# Patient Record
Sex: Female | Born: 1995 | Race: Black or African American | Hispanic: No | Marital: Married | Smoking: Current some day smoker
Health system: Southern US, Community
[De-identification: ages and names within clinical notes are randomized; demographics above are authoritative.]

## PROBLEM LIST (undated history)

## (undated) ENCOUNTER — Ambulatory Visit (HOSPITAL_COMMUNITY): Admission: EM | Payer: Self-pay | Source: Home / Self Care

## (undated) DIAGNOSIS — F909 Attention-deficit hyperactivity disorder, unspecified type: Secondary | ICD-10-CM

## (undated) DIAGNOSIS — R51 Headache: Secondary | ICD-10-CM

## (undated) DIAGNOSIS — R569 Unspecified convulsions: Secondary | ICD-10-CM

## (undated) DIAGNOSIS — R519 Headache, unspecified: Secondary | ICD-10-CM

## (undated) HISTORY — DX: Headache: R51

## (undated) HISTORY — DX: Headache, unspecified: R51.9

---
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
## 2017-02-24 IMAGING — US US ABDOMEN COMPLETE
1 series · 14 of 25 positions shown · non-contrast
Comparison: None.

CLINICAL DATA: Chronic generalized abdominal pain.

EXAM:
ABDOMEN ULTRASOUND COMPLETE

[Series 1: us abdomen complete · 0.17mm/px · 14 of 109 slices shown]
[im 1/109]
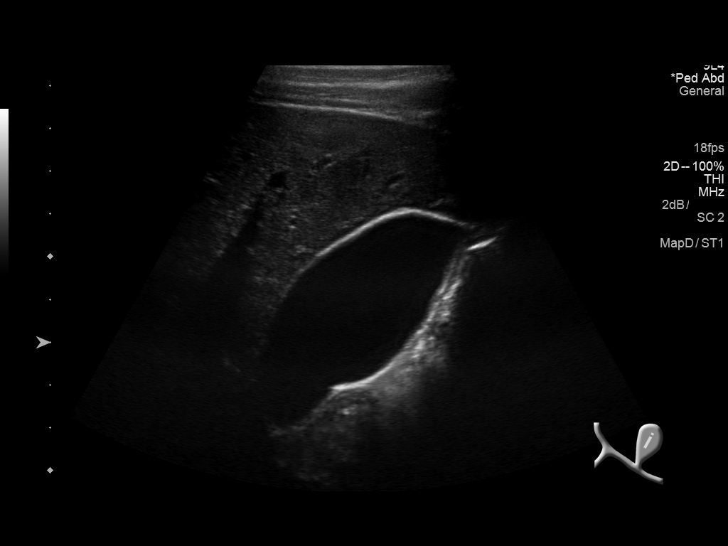
[im 10/109]
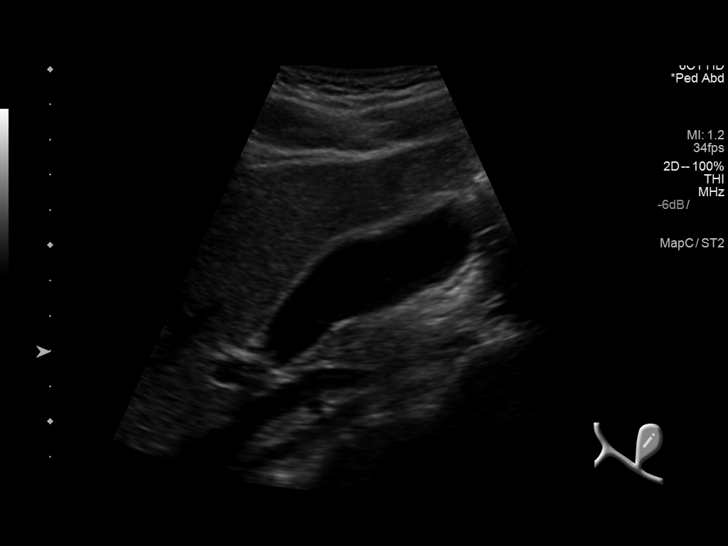
[im 19/109]
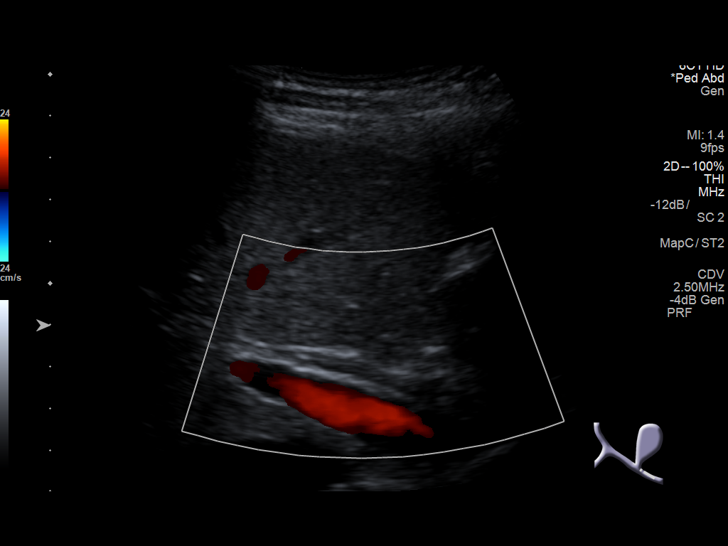
[im 28/109]
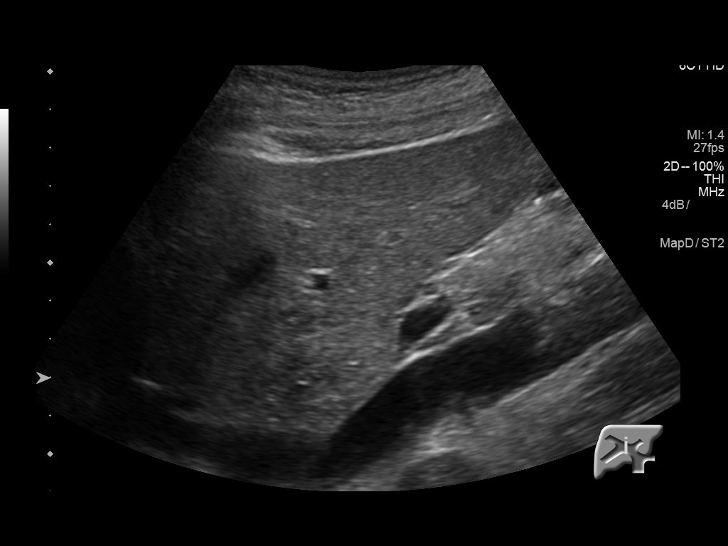
[im 37/109]
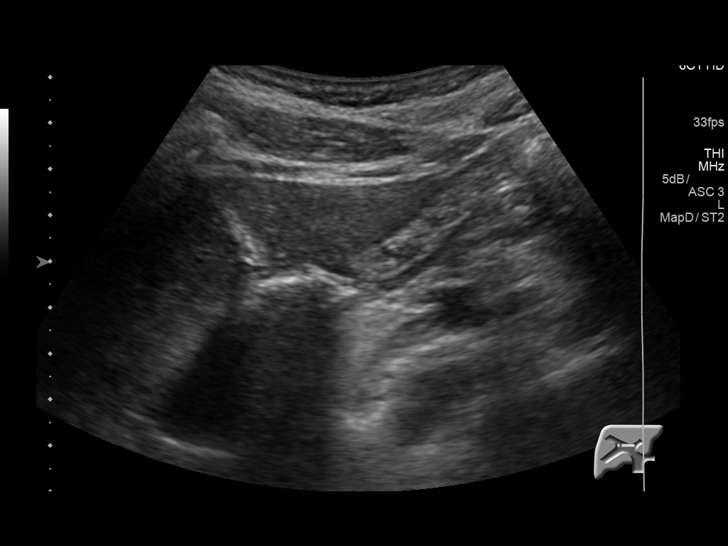
[im 41/109]
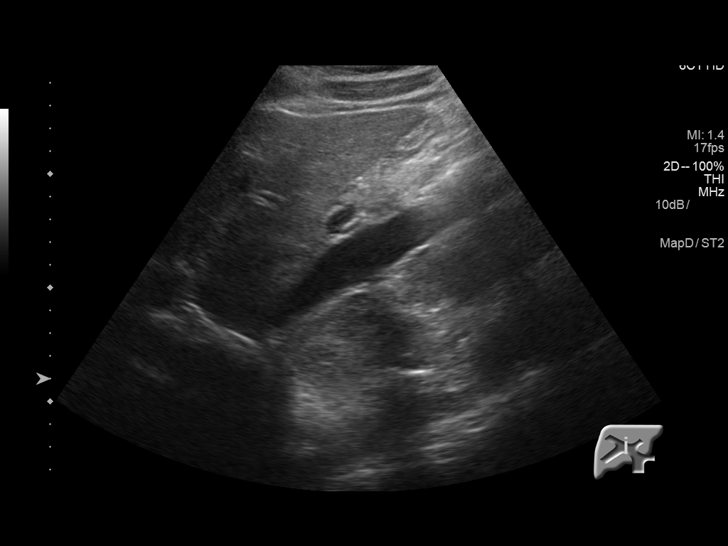
[im 50/109]
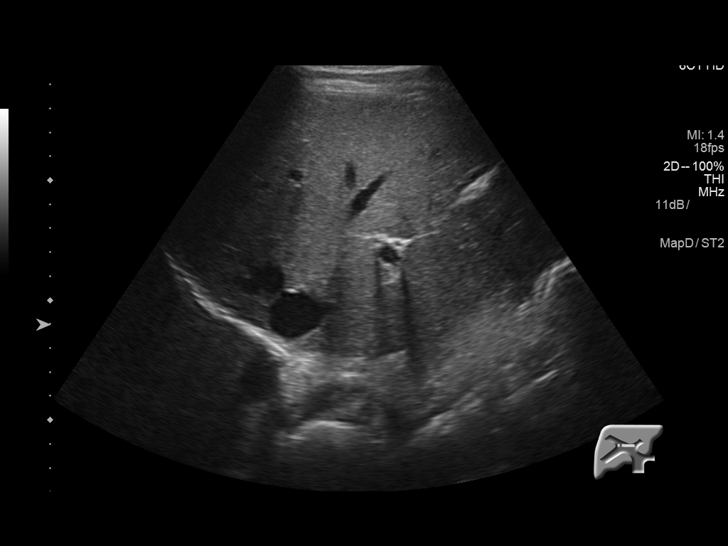
[im 59/109]
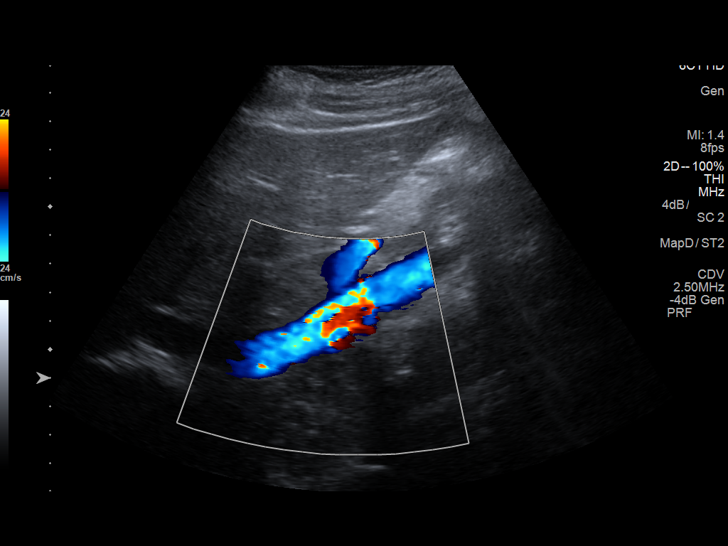
[im 68/109]
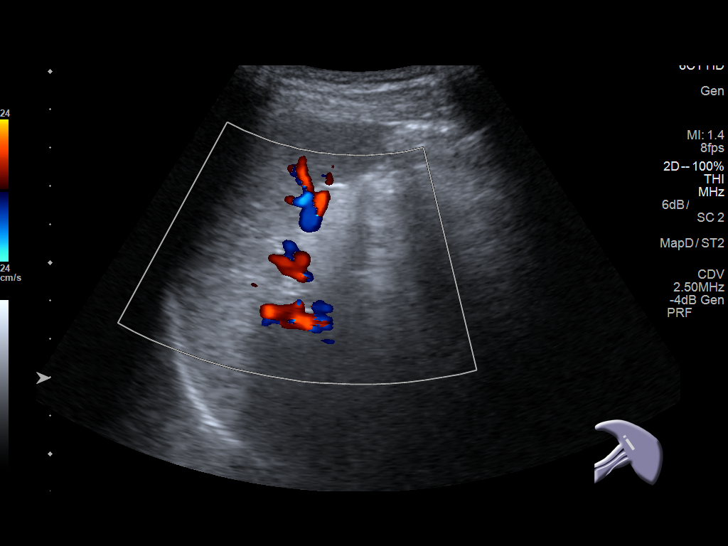
[im 73/109]
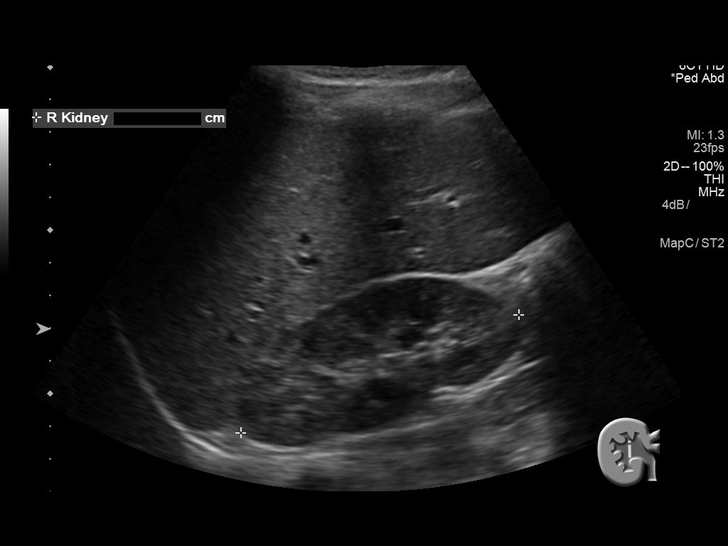
[im 82/109]
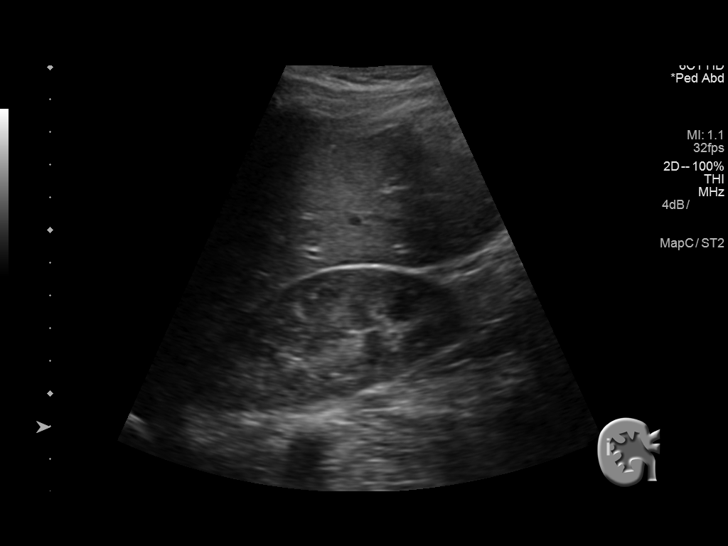
[im 91/109]
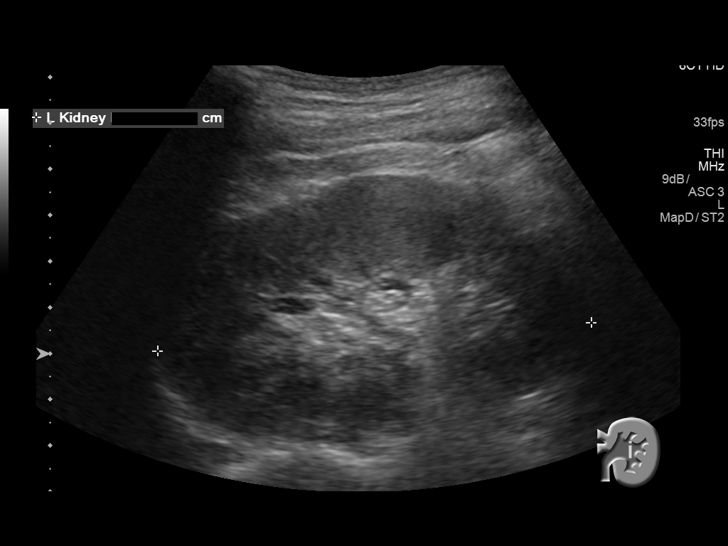
[im 100/109]
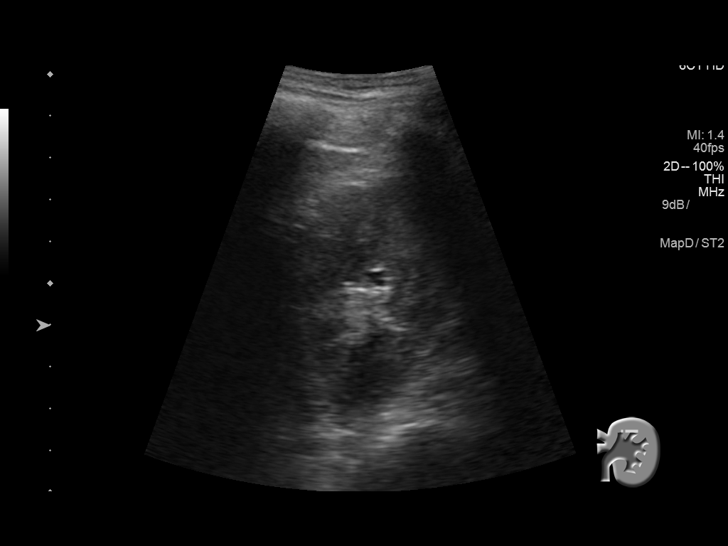
[im 109/109]
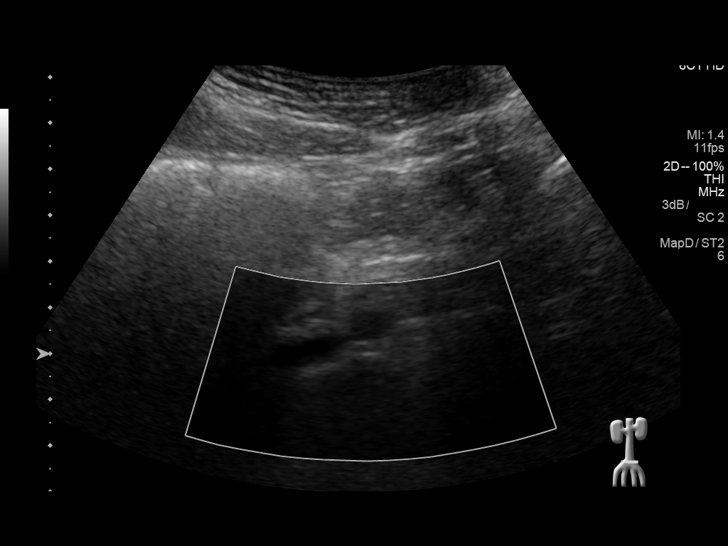

[14 of 25 positions shown; findings below may reference images not displayed]

FINDINGS: Gallbladder: No gallstones or wall thickening visualized. No
sonographic Murphy sign noted by sonographer.

Common bile duct: Diameter: 3.5 mm which is within normal limits.

Liver: No focal lesion identified. Within normal limits in
parenchymal echogenicity.

IVC: No abnormality visualized.

Pancreas: Visualized portion unremarkable.

Spleen: Size and appearance within normal limits.

Right Kidney: Length: 9.2 cm. Echogenicity within normal limits. No
mass or hydronephrosis visualized.

Left Kidney: Length: 9.4 cm. Echogenicity within normal limits. No
mass or hydronephrosis visualized.

Abdominal aorta: No aneurysm visualized.

Other findings: None.
IMPRESSION: No definite abnormality seen in the abdomen.

## 2021-08-17 DIAGNOSIS — R569 Unspecified convulsions: Secondary | ICD-10-CM | POA: Diagnosis not present

## 2021-11-30 ENCOUNTER — Emergency Department: Payer: Medicaid Other

## 2021-11-30 ENCOUNTER — Emergency Department
Admission: EM | Admit: 2021-11-30 | Discharge: 2021-11-30 | Disposition: A | Discharge status: Home / Self Care | Payer: Medicaid Other | Attending: Emergency Medicine | Admitting: Emergency Medicine

## 2021-11-30 ENCOUNTER — Other Ambulatory Visit: Payer: Self-pay

## 2021-11-30 DIAGNOSIS — R569 Unspecified convulsions: Secondary | ICD-10-CM | POA: Diagnosis not present

## 2021-11-30 DIAGNOSIS — G40909 Epilepsy, unspecified, not intractable, without status epilepticus: Secondary | ICD-10-CM | POA: Diagnosis present

## 2021-11-30 LAB — COMPREHENSIVE METABOLIC PANEL
ALT: 12 U/L (ref 0–44)
AST: 16 U/L (ref 15–41)
Albumin: 4.1 g/dL (ref 3.5–5.0)
Alkaline Phosphatase: 33 U/L — ABNORMAL LOW (ref 38–126)
Anion gap: 6 (ref 5–15)
BUN: 13 mg/dL (ref 6–20)
CO2: 25 mmol/L (ref 22–32)
Calcium: 9 mg/dL (ref 8.9–10.3)
Chloride: 107 mmol/L (ref 98–111)
Creatinine, Ser: 0.83 mg/dL (ref 0.44–1.00)
GFR, Estimated: >60 mL/min (ref >60)
Glucose, Bld: 129 mg/dL — ABNORMAL HIGH (ref 70–99)
Potassium: 3 mmol/L — ABNORMAL LOW (ref 3.5–5.1)
Sodium: 138 mmol/L (ref 135–145)
Total Bilirubin: 0.6 mg/dL (ref 0.3–1.2)
Total Protein: 7.3 g/dL (ref 6.5–8.1)

## 2021-11-30 LAB — CBC WITH DIFFERENTIAL/PLATELET
Abs Immature Granulocytes: 0.01 10*3/uL (ref 0.00–0.07)
Basophils Absolute: 0.1 10*3/uL (ref 0.0–0.1)
Basophils Relative: 1 %
Eosinophils Absolute: 0.1 10*3/uL (ref 0.0–0.5)
Eosinophils Relative: 2 %
HCT: 33.7 % — ABNORMAL LOW (ref 36.0–46.0)
Hemoglobin: 11.2 g/dL — ABNORMAL LOW (ref 12.0–15.0)
Immature Granulocytes: 0 %
Lymphocytes Relative: 49 %
Lymphs Abs: 2.4 10*3/uL (ref 0.7–4.0)
MCH: 33.6 pg (ref 26.0–34.0)
MCHC: 33.2 g/dL (ref 30.0–36.0)
MCV: 101.2 fL — ABNORMAL HIGH (ref 80.0–100.0)
Monocytes Absolute: 0.3 10*3/uL (ref 0.1–1.0)
Monocytes Relative: 7 %
Neutro Abs: 2 10*3/uL (ref 1.7–7.7)
Neutrophils Relative %: 41 %
Platelets: 245 10*3/uL (ref 150–400)
RBC: 3.33 MIL/uL — ABNORMAL LOW (ref 3.87–5.11)
RDW: 12.5 % (ref 11.5–15.5)
WBC: 4.9 10*3/uL (ref 4.0–10.5)
nRBC: 0 % (ref 0.0–0.2)

## 2021-11-30 LAB — HCG, QUANTITATIVE, PREGNANCY: hCG, Beta Chain, Quant, S: <1 m[IU]/mL (ref <5)

## 2021-11-30 MED ORDER — POTASSIUM CHLORIDE CRYS ER 20 MEQ PO TBCR
40.0000 meq | EXTENDED_RELEASE_TABLET | Freq: Once | ORAL | Status: AC
  Administered 2021-11-30: 40 meq via ORAL
  Filled 2021-11-30 (×2): qty 2

## 2021-11-30 MED ORDER — ACETAMINOPHEN 500 MG PO TABS
1000.0000 mg | ORAL_TABLET | Freq: Once | ORAL | Status: AC
  Administered 2021-11-30: 1000 mg via ORAL
  Filled 2021-11-30: qty 2

## 2021-11-30 MED ORDER — LEVETIRACETAM 500 MG PO TABS: 1000.0000 mg | ORAL_TABLET | Freq: Two times a day (BID) | ORAL | 0 refills | Status: AC

## 2021-11-30 MED ORDER — SODIUM CHLORIDE 0.9 % IV SOLN
2000.0000 mg | Freq: Once | INTRAVENOUS | Status: AC
  Administered 2021-11-30: 2000 mg via INTRAVENOUS
  Filled 2021-11-30: qty 20

## 2021-11-30 NOTE — ED Provider Notes (Addendum)
[facility] Provider Note    Event Date/Time   First MD Initiated Contact with Patient 11/30/21 1808     (approximate)   History   Seizures   HPI  Alicia Stone is a 26 y.o. female with past medical history of seizure disorder not on any AEDs who presents with possible seizure.  Per EMS patient's family said she had a seizure for about 5 minutes.  Initially patient was unresponsive but then started to open her eyes but not speaking.  Did not receive any benzos in the field.  Patient is not talking but she is opening her eyes and nodding yes and no to questions says she does not take any medications.  Denies any recent illnesses does endorse headache says that she typically gets headache after seizure.  Reviewed inpatient admission at [facility] from 2022 patient required intubation and sedation for status epilepticus.  Was discharged on Keppra.     Past Medical History:  Diagnosis Date   ADHD (attention deficit hyperactivity disorder)    Headache    Seizure [facility])     Patient Active Problem List   Diagnosis Date Noted   Annual physical exam 11/02/2014   Family planning 08/17/2014   Seizure disorder (HCC) 08/17/2014   Migraine without aura and without status migrainosus, not intractable 02/21/2014     Physical Exam  Triage Vital Signs: ED Triage Vitals  Enc Vitals Group     BP 11/30/21 1800 126/76     Pulse Rate 11/30/21 1800 84     Resp 11/30/21 1800 17     Temp 11/30/21 1800 98.8 F (37.1 C)     Temp Source 11/30/21 1800 Oral     SpO2 11/30/21 1800 99 %     Weight 11/30/21 1802 123 lb 7.3 oz (56 kg)     Height 11/30/21 1802 5\' 10"  (1.778 m)     Head Circumference --      Peak Flow --      Pain Score 11/30/21 1802 0     Pain Loc --      Pain Edu? --      Excl. in GC? --     Most recent vital signs: Vitals:   11/30/21 1800 11/30/21 1900  BP: 126/76 (!) 110/55  Pulse: 84 80  Resp: 17 18  Temp: 98.8 F (37.1 C)   SpO2: 99% 99%      General: Awake, no distress.  CV:  Good peripheral perfusion.  Resp:  Normal effort.  Abd:  No distention.  Neuro:             Awake, Alert, patient's eyes are open and she moves all of her extremities but is not speaking somewhat somnolent Other:     ED Results / Procedures / Treatments  Labs (all labs ordered are listed, but only abnormal results are displayed) Labs Reviewed  COMPREHENSIVE METABOLIC PANEL - Abnormal; Notable for the following components:      Result Value   Potassium 3.0 (*)    Glucose, Bld 129 (*)    Alkaline Phosphatase 33 (*)    All other components within normal limits  CBC WITH DIFFERENTIAL/PLATELET - Abnormal; Notable for the following components:   RBC 3.33 (*)    Hemoglobin 11.2 (*)    HCT 33.7 (*)    MCV 101.2 (*)    All other components within normal limits  HCG, QUANTITATIVE, PREGNANCY     EKG     RADIOLOGY  I  reviewed and interpreted the CT scan of the brain which does not show any acute intracranial process   PROCEDURES:  Critical Care performed: No  Procedures  The patient is on the cardiac monitor to evaluate for evidence of arrhythmia and/or significant heart rate changes.   MEDICATIONS ORDERED IN ED: Medications  levETIRAcetam (KEPPRA) 2,000 mg in sodium chloride 0.9 % 250 mL IVPB (0 mg Intravenous Stopped 11/30/21 1942)  potassium chloride SA (KLOR-CON M) CR tablet 40 mEq (40 mEq Oral Given 11/30/21 1950)  acetaminophen (TYLENOL) tablet 1,000 mg (1,000 mg Oral Given 11/30/21 1949)     IMPRESSION / MDM / ASSESSMENT AND PLAN / ED COURSE  I reviewed the triage vital signs and the nursing notes.                              Patient's presentation is most consistent with acute presentation with potential threat to life or bodily function.  Differential diagnosis includes, but is not limited to, seizure, pseudoseizure, syncope, intoxication, overdose  Patient is a 26 year old female with history of underlying epilepsy  not currently on any AEDs presents with concern for 5-minute seizure per family report.  Her vitals are reassuring patient appears somewhat postictal she is somnolent but opens her eyes not yet talking but moves all of her extremities pupils are equal and reactive.  We will load with Keppra as I see patient had been prescribed Keppra when she was discharged from [facility] after she required admission for status epilepticus requiring intubation.  We will check basic labs and pregnancy test.  Given patient's known history of seizures if she continues to progress and return to baseline do not think head imaging is necessary.    7:58 PM Patient improving she is not talking to me says she has a sore throat and headache.  Given patient still not back to baseline and does not have frequent CTs in our system will obtain CT of head and give Tylenol for sore throat.  Posterior oropharyngeal exam is unremarkable.   FINAL CLINICAL IMPRESSION(S) / ED DIAGNOSES   Final diagnoses:  Seizure (HCC)     Rx / DC Orders   ED Discharge Orders     None        Note:  This document was prepared using Dragon voice recognition software and may include unintentional dictation errors.   Georga Hacking, MD 11/30/21 1919    Georga Hacking, MD 11/30/21 1958

## 2021-11-30 NOTE — ED Notes (Signed)
Pt taken to CT.

## 2021-11-30 NOTE — ED Notes (Addendum)
[phone] Juliette Alcide (aunt) [place] [phone]

## 2021-11-30 NOTE — ED Notes (Signed)
Pt is more alert at this time and was able to swallow her pills with no issue. Called her family members at this time per her request but noone answered. She did leave a voicemail for her grandmother.

## 2021-11-30 NOTE — Discharge Instructions (Addendum)
Please start taking the Keppra 1 g twice daily for 30 days.  Please follow-up with the neurologist above for further management of your seizure disorder.

## 2021-11-30 NOTE — ED Triage Notes (Addendum)
Pt family states she had a seizure for 5 min when fire and EMS arrived pt was unresponsive. Pt arrives via EMS still postictal pt is alert and protecting her own airway. Pt will nod yes and no but is not speaking. Pt has a hx of seizures but is not on meds according to family.

## 2021-11-30 NOTE — ED Notes (Signed)
This RN called again and grandmother answered. Pt is currently on the phone with her.

## 2021-11-30 NOTE — ED Notes (Signed)
Seizure pads placed on bed

## 2021-11-30 NOTE — ED Notes (Signed)
Aunt came to pick up pt. Pt is aaox4 with steady gait.

## 2022-01-21 DIAGNOSIS — Z1389 Encounter for screening for other disorder: Secondary | ICD-10-CM | POA: Diagnosis not present

## 2022-01-21 DIAGNOSIS — R630 Anorexia: Secondary | ICD-10-CM | POA: Diagnosis not present

## 2022-01-21 DIAGNOSIS — Z7189 Other specified counseling: Secondary | ICD-10-CM | POA: Diagnosis not present

## 2022-01-21 DIAGNOSIS — Z131 Encounter for screening for diabetes mellitus: Secondary | ICD-10-CM | POA: Diagnosis not present

## 2022-01-21 DIAGNOSIS — Z1159 Encounter for screening for other viral diseases: Secondary | ICD-10-CM | POA: Diagnosis not present

## 2022-01-21 DIAGNOSIS — Z13220 Encounter for screening for lipoid disorders: Secondary | ICD-10-CM | POA: Diagnosis not present

## 2022-02-16 DIAGNOSIS — R569 Unspecified convulsions: Secondary | ICD-10-CM | POA: Diagnosis not present

## 2022-02-16 DIAGNOSIS — G4089 Other seizures: Secondary | ICD-10-CM | POA: Diagnosis not present

## 2022-04-25 DIAGNOSIS — Z124 Encounter for screening for malignant neoplasm of cervix: Secondary | ICD-10-CM | POA: Diagnosis not present

## 2022-04-25 DIAGNOSIS — Z1389 Encounter for screening for other disorder: Secondary | ICD-10-CM | POA: Diagnosis not present

## 2022-04-25 DIAGNOSIS — F411 Generalized anxiety disorder: Secondary | ICD-10-CM | POA: Diagnosis not present

## 2022-04-25 DIAGNOSIS — Z113 Encounter for screening for infections with a predominantly sexual mode of transmission: Secondary | ICD-10-CM | POA: Diagnosis not present

## 2022-04-25 DIAGNOSIS — Z0000 Encounter for general adult medical examination without abnormal findings: Secondary | ICD-10-CM | POA: Diagnosis not present

## 2022-06-04 DIAGNOSIS — F411 Generalized anxiety disorder: Secondary | ICD-10-CM | POA: Diagnosis not present

## 2022-06-04 DIAGNOSIS — Z1389 Encounter for screening for other disorder: Secondary | ICD-10-CM | POA: Diagnosis not present

## 2023-10-27 ENCOUNTER — Encounter (HOSPITAL_COMMUNITY): Payer: Self-pay | Admitting: Pharmacy Technician

## 2023-10-27 ENCOUNTER — Emergency Department (HOSPITAL_COMMUNITY)
Admission: EM | Admit: 2023-10-27 | Discharge: 2023-10-27 | Disposition: A | Discharge status: Home / Self Care | Source: Ambulatory Visit | Attending: Emergency Medicine | Admitting: Emergency Medicine

## 2023-10-27 ENCOUNTER — Other Ambulatory Visit: Payer: Self-pay

## 2023-10-27 ENCOUNTER — Encounter (HOSPITAL_COMMUNITY): Payer: Self-pay | Admitting: Emergency Medicine

## 2023-10-27 ENCOUNTER — Ambulatory Visit (HOSPITAL_COMMUNITY): Admission: EM | Admit: 2023-10-27 | Discharge: 2023-10-27 | Disposition: A | Discharge status: Home / Self Care

## 2023-10-27 ENCOUNTER — Emergency Department (HOSPITAL_COMMUNITY)

## 2023-10-27 DIAGNOSIS — L02511 Cutaneous abscess of right hand: Secondary | ICD-10-CM | POA: Diagnosis not present

## 2023-10-27 DIAGNOSIS — L923 Foreign body granuloma of the skin and subcutaneous tissue: Secondary | ICD-10-CM

## 2023-10-27 DIAGNOSIS — L03113 Cellulitis of right upper limb: Secondary | ICD-10-CM | POA: Insufficient documentation

## 2023-10-27 DIAGNOSIS — M79641 Pain in right hand: Secondary | ICD-10-CM | POA: Diagnosis present

## 2023-10-27 DIAGNOSIS — Z23 Encounter for immunization: Secondary | ICD-10-CM | POA: Insufficient documentation

## 2023-10-27 DIAGNOSIS — L03011 Cellulitis of right finger: Secondary | ICD-10-CM

## 2023-10-27 DIAGNOSIS — L0291 Cutaneous abscess, unspecified: Secondary | ICD-10-CM

## 2023-10-27 LAB — COMPREHENSIVE METABOLIC PANEL WITH GFR
ALT: 12 U/L (ref 0–44)
AST: 17 U/L (ref 15–41)
Albumin: 4 g/dL (ref 3.5–5.0)
Alkaline Phosphatase: 35 U/L — ABNORMAL LOW (ref 38–126)
Anion gap: 10 (ref 5–15)
BUN: 14 mg/dL (ref 6–20)
CO2: 26 mmol/L (ref 22–32)
Calcium: 9.5 mg/dL (ref 8.9–10.3)
Chloride: 104 mmol/L (ref 98–111)
Creatinine, Ser: 0.82 mg/dL (ref 0.44–1.00)
GFR, Estimated: >60 mL/min (ref >60)
Glucose, Bld: 88 mg/dL (ref 70–99)
Potassium: 4.1 mmol/L (ref 3.5–5.1)
Sodium: 140 mmol/L (ref 135–145)
Total Bilirubin: 0.5 mg/dL (ref 0.0–1.2)
Total Protein: 7.1 g/dL (ref 6.5–8.1)

## 2023-10-27 LAB — HCG, SERUM, QUALITATIVE: Preg, Serum: NEGATIVE

## 2023-10-27 LAB — CBC WITH DIFFERENTIAL/PLATELET
Abs Immature Granulocytes: 0.01 K/uL (ref 0.00–0.07)
Basophils Absolute: 0.1 K/uL (ref 0.0–0.1)
Basophils Relative: 1 %
Eosinophils Absolute: 0.3 K/uL (ref 0.0–0.5)
Eosinophils Relative: 5 %
HCT: 34.3 % — ABNORMAL LOW (ref 36.0–46.0)
Hemoglobin: 11 g/dL — ABNORMAL LOW (ref 12.0–15.0)
Immature Granulocytes: 0 %
Lymphocytes Relative: 45 %
Lymphs Abs: 2.5 K/uL (ref 0.7–4.0)
MCH: 33.8 pg (ref 26.0–34.0)
MCHC: 32.1 g/dL (ref 30.0–36.0)
MCV: 105.5 fL — ABNORMAL HIGH (ref 80.0–100.0)
Monocytes Absolute: 0.4 K/uL (ref 0.1–1.0)
Monocytes Relative: 6 %
Neutro Abs: 2.4 K/uL (ref 1.7–7.7)
Neutrophils Relative %: 43 %
Platelets: 213 K/uL (ref 150–400)
RBC: 3.25 MIL/uL — ABNORMAL LOW (ref 3.87–5.11)
RDW: 12.3 % (ref 11.5–15.5)
WBC: 5.7 K/uL (ref 4.0–10.5)
nRBC: 0 % (ref 0.0–0.2)

## 2023-10-27 MED ORDER — TETANUS-DIPHTH-ACELL PERTUSSIS 5-2.5-18.5 LF-MCG/0.5 IM SUSY
0.5000 mL | PREFILLED_SYRINGE | Freq: Once | INTRAMUSCULAR | Status: AC
  Administered 2023-10-27: 0.5 mL via INTRAMUSCULAR
  Filled 2023-10-27: qty 0.5

## 2023-10-27 MED ORDER — KETOROLAC TROMETHAMINE 15 MG/ML IJ SOLN: 15.0000 mg | Freq: Once | INTRAMUSCULAR | Status: DC

## 2023-10-27 MED ORDER — LIDOCAINE HCL (PF) 1 % IJ SOLN
10.0000 mL | Freq: Once | INTRAMUSCULAR | Status: AC
  Administered 2023-10-27: 10 mL
  Filled 2023-10-27: qty 10

## 2023-10-27 MED ORDER — DOXYCYCLINE HYCLATE 100 MG PO CAPS: 100.0000 mg | ORAL_CAPSULE | Freq: Two times a day (BID) | ORAL | 0 refills | Status: AC

## 2023-10-27 NOTE — ED Triage Notes (Signed)
 Pt here POV from UC for possible infection to tattoo site on R hand 4th and 5th digits.

## 2023-10-27 NOTE — ED Triage Notes (Signed)
 Pt reports got tattoo on right 4th and 5th fingers last week. Reports told the artist that was going too deep because was hurting. Since Friday unable to move 4th and 5th fingers. Has drainage and some swelling.

## 2023-10-27 NOTE — ED Notes (Signed)
 Wound care performed

## 2023-10-27 NOTE — Discharge Instructions (Addendum)
 You were seen today for cellulitis and abscesses to your right hand over your 5th and 4th fingers after a tattoo.  It will continue to drain over the course of the next couple days.  However recommend you continue to follow-up with hand surgery for reevaluation and resolution of symptoms.  I am sending an antibiotic which you are to take twice a day for the next 7 days.  This can cause stomach upset, take with food help avoid this.  Recommend continue take Tylenol  and ibuprofen for pain medication.  Take Tylenol  (acetominophen)  650mg  every 4-6 hours, as needed for pain or fever. Do not take more than 4,000 mg in a 24-hour period. As this may cause liver damage. While this is rare, if you begin to develop yellowing of the skin or eyes, stop taking and return to ER immediately.  Take Ibuprofen 400mg  every 4-6 hours for pain or fever, not exceeding 3,200 mg per day as more than 3,200mg  can cause Stomach irritation, dizziness, kidney issues with long-term use.  Please return sooner if symptoms worsen as further workup may need to be done at that time.

## 2023-10-27 NOTE — ED Provider Notes (Signed)
 [facility]    CSN: [id number] Arrival date & time: 10/27/23  9141      History   Chief Complaint Chief Complaint  Patient presents with   Hand Problem    HPI Alicia Stone is a 28 y.o. female.   Patient presents today for evaluation of suspected infection secondary to tattoo she had placed on her right hand last week.  She reports that she had significant pain at the time of the tattoo and had difficulty moving her 4th and 5th fingers but tattoo artist continued.  Since Friday patient has been unable to move her 4th and 5th fingers due to pain and swelling.  She has had some drainage as well from the area.  She does not report fever.  She does not have any numbness.  The history is provided by the patient.    Past Medical History:  Diagnosis Date   ADHD (attention deficit hyperactivity disorder)    Headache    Seizure [facility])     Patient Active Problem List   Diagnosis Date Noted   Annual physical exam 11/02/2014   Family planning 08/17/2014   Seizure disorder (HCC) 08/17/2014   Migraine without aura and without status migrainosus, not intractable 02/21/2014    Past Surgical History:  Procedure Laterality Date   TONSILLECTOMY  2012    OB History   No obstetric history on file.      Home Medications    Prior to Admission medications   Medication Sig Start Date End Date Taking? Authorizing Provider  DEXEDRINE 15 MG 24 hr capsule  01/12/14   [provider]  ibuprofen (ADVIL,MOTRIN) 800 MG tablet Take 800 mg by mouth every 8 (eight) hours as needed. 09/12/14   [provider]  levETIRAcetam  (KEPPRA ) 500 MG tablet Take 2 tablets (1,000 mg total) by mouth 2 (two) times daily. 11/30/21 12/30/21  Clide Burnard Ee, MD  Oxcarbazepine  (TRILEPTAL ) 300 MG tablet TAKE 1 TABLET BY MOUTH TWICE A DAY 10/09/14   [provider]  predniSONE (DELTASONE) 10 MG tablet TAKE 6 TABLETS BY MOUTH ON DAY 1 THEN DECREASE BY 1 TABLET EACH DAY  (6,5,4,3,2,1) 09/28/14   [provider]    Family History Family History  Family history unknown: Yes    Social History Social History   Tobacco Use   Smoking status: Some Days    Current packs/day: 0.50    Types: Cigarettes    Passive exposure: Yes   Smokeless tobacco: Never  Vaping Use   Vaping status: Some Days   Substances: Nicotine  Substance Use Topics   Alcohol use: No    Alcohol/week: 0.0 standard drinks of alcohol   Drug use: Yes    Types: Marijuana    Comment: occasionally     Allergies   Patient has no known allergies.   Review of Systems Review of Systems  Constitutional:  Negative for chills and fever.  Eyes:  Negative for discharge and redness.  Respiratory:  Negative for shortness of breath.   Gastrointestinal:  Negative for nausea and vomiting.  Musculoskeletal:  Positive for arthralgias and myalgias.  Skin:  Positive for color change and wound.  Neurological:  Negative for numbness.     Physical Exam Triage Vital Signs ED Triage Vitals  Encounter Vitals Group     BP 10/27/23 0923 113/71     Girls Systolic BP Percentile --      Girls Diastolic BP Percentile --      Boys Systolic BP  Percentile --      Boys Diastolic BP Percentile --      Pulse Rate 10/27/23 0923 62     Resp 10/27/23 0923 14     Temp 10/27/23 0923 98.1 F (36.7 C)     Temp Source 10/27/23 0923 Oral     SpO2 10/27/23 0923 99 %     Weight --      Height --      Head Circumference --      Peak Flow --      Pain Score 10/27/23 0921 6     Pain Loc --      Pain Education --      Exclude from Growth Chart --    No data found.  Updated Vital Signs BP 113/71 (BP Location: Left Arm)   Pulse 62   Temp 98.1 F (36.7 C) (Oral)   Resp 14   LMP 10/13/2023 (Approximate)   SpO2 99%   Visual Acuity Right Eye Distance:   Left Eye Distance:   Bilateral Distance:    Right Eye Near:   Left Eye Near:    Bilateral Near:     Physical Exam Vitals and nursing note  reviewed.  Constitutional:      General: She is not in acute distress.    Appearance: Normal appearance. She is not ill-appearing.  HENT:     Head: Normocephalic and atraumatic.  Eyes:     Conjunctiva/sclera: Conjunctivae normal.  Cardiovascular:     Rate and Rhythm: Normal rate.  Pulmonary:     Effort: Pulmonary effort is normal. No respiratory distress.  Musculoskeletal:     Comments: Patient unable to move 4th and 5th fingers of right hand.  See photos for skin changes and swelling documentation.  Neurological:     Mental Status: She is alert.     Comments: Gross sensation intact to distal right 4th and 5th fingers  Psychiatric:        Mood and Affect: Mood normal.        Behavior: Behavior normal.        Thought Content: Thought content normal.         UC Treatments / Results  Labs (all labs ordered are listed, but only abnormal results are displayed) Labs Reviewed - No data to display  EKG   Radiology No results found.  Procedures Procedures (including critical care time)  Medications Ordered in UC Medications - No data to display  Initial Impression / Assessment and Plan / UC Course  I have reviewed the triage vital signs and the nursing notes.  Pertinent labs & imaging results that were available during my care of the patient were reviewed by me and considered in my medical decision making (see chart for details).    Given concern for deep tissue infection recommended further evaluation in the emergency room for more advanced imaging.  Patient is agreeable to same.  Final Clinical Impressions(s) / UC Diagnoses   Final diagnoses:  Tattoo reaction  Cellulitis of finger of right hand   Discharge Instructions   None    ED Prescriptions   None    PDMP not reviewed this encounter.   Billy Asberry FALCON, PA-C 10/27/23 [phone]

## 2023-10-27 NOTE — ED Provider Notes (Signed)
 [name] [facility] AT [facility] Provider Note   CSN: [id number] Arrival date & time: 10/27/23  9048     Patient presents with: Hand Problem   Alicia Stone is a 28 y.o. female.   HPI Patient is 28 year old female to the ED today with concerns for increased right hand pain and decreased range of motion at 5th and 4th fingers that has been progressively worse since her tattoo on those fingers approximately 4 days ago.  Notes that when she tried to flex and make a fist today that she had drainage coming from the tops of her fingers with the skin beginning to split.  Has had increased pain going over the dorsum of her hand up to her wrist.  However has full range of motion at the wrist.  Denies fever, numbness, tingling.    Prior to Admission medications   Medication Sig Start Date End Date Taking? Authorizing Provider  doxycycline  (VIBRAMYCIN ) 100 MG capsule Take 1 capsule (100 mg total) by mouth 2 (two) times daily for 7 days. 10/27/23 11/03/23 Yes Yuuki Skeens S, PA-C  DEXEDRINE 15 MG 24 hr capsule  01/12/14   [provider]  ibuprofen (ADVIL,MOTRIN) 800 MG tablet Take 800 mg by mouth every 8 (eight) hours as needed. 09/12/14   [provider]  levETIRAcetam  (KEPPRA ) 500 MG tablet Take 2 tablets (1,000 mg total) by mouth 2 (two) times daily. 11/30/21 12/30/21  Clide Burnard Ee, MD  Oxcarbazepine  (TRILEPTAL ) 300 MG tablet TAKE 1 TABLET BY MOUTH TWICE A DAY 10/09/14   [provider]  predniSONE (DELTASONE) 10 MG tablet TAKE 6 TABLETS BY MOUTH ON DAY 1 THEN DECREASE BY 1 TABLET EACH DAY (6,5,4,3,2,1) 09/28/14   [provider]    Allergies: Patient has no known allergies.    Review of Systems  Musculoskeletal:  Positive for joint swelling and myalgias.  All other systems reviewed and are negative.   Updated Vital Signs BP (!) 105/57   Pulse 66   Temp 98 F (36.7 C) (Oral)   Resp 16   LMP 10/13/2023 (Approximate)    SpO2 100%   Physical Exam Vitals and nursing note reviewed.  Constitutional:      General: She is not in acute distress.    Appearance: Normal appearance. She is not ill-appearing or diaphoretic.  HENT:     Head: Normocephalic and atraumatic.  Eyes:     General:        Right eye: No discharge.        Left eye: No discharge.     Extraocular Movements: Extraocular movements intact.     Conjunctiva/sclera: Conjunctivae normal.  Cardiovascular:     Rate and Rhythm: Normal rate and regular rhythm.     Pulses: Normal pulses.     Heart sounds: Normal heart sounds. No murmur heard.    No friction rub. No gallop.  Pulmonary:     Effort: Pulmonary effort is normal. No respiratory distress.     Breath sounds: Normal breath sounds. No stridor. No wheezing, rhonchi or rales.  Abdominal:     General: Abdomen is flat.     Palpations: Abdomen is soft.     Tenderness: There is no abdominal tenderness.  Musculoskeletal:        General: Swelling, tenderness and signs of injury present.     Cervical back: Normal range of motion and neck supple. No rigidity or tenderness.     Comments: Patient has notable swelling, erythema and purulent  drainage from the dorsum of 4th and 5th finger as well as MCP joints of right hand.  Good cap refill on all fingers.  Good radial pulse 2+.  Good range of motion at wrist however poor range of motion at the 4th and 5th fingers with the inability to fully extend or flex.  Skin:    General: Skin is warm and dry.     Capillary Refill: Capillary refill takes less than 2 seconds.     Findings: Erythema present. No bruising.  Neurological:     General: No focal deficit present.     Mental Status: She is alert and oriented to person, place, and time. Mental status is at baseline.     Sensory: No sensory deficit.  Psychiatric:        Mood and Affect: Mood normal.     (all labs ordered are listed, but only abnormal results are displayed) Labs Reviewed  COMPREHENSIVE  METABOLIC PANEL WITH GFR - Abnormal; Notable for the following components:      Result Value   Alkaline Phosphatase 35 (*)    All other components within normal limits  CBC WITH DIFFERENTIAL/PLATELET - Abnormal; Notable for the following components:   RBC 3.25 (*)    Hemoglobin 11.0 (*)    HCT 34.3 (*)    MCV 105.5 (*)    All other components within normal limits  HCG, SERUM, QUALITATIVE    EKG: None  Radiology: DG Hand Complete Right Result Date: 10/27/2023 CLINICAL DATA:  infection EXAM: RIGHT HAND - COMPLETE 3+ VIEW COMPARISON:  None Available. FINDINGS: No acute fracture or dislocation. Well corticated ossific fragment along the volar base of the fourth middle phalanx, likely representing remote trauma. There is no evidence of arthropathy or other focal bone abnormality. Soft tissues are unremarkable. No radiopaque foreign body. IMPRESSION: 1. No radiographic findings of osteomyelitis. 2. No acute fracture or dislocation. Electronically Signed   By: Rogelia Myers M.D.   On: 10/27/2023 11:32    Procedures   Medications Ordered in the ED  ketorolac  (TORADOL ) 15 MG/ML injection 15 mg (has no administration in time range)  Tdap (BOOSTRIX ) injection 0.5 mL (0.5 mLs Intramuscular Given 10/27/23 1525)  lidocaine  (PF) (XYLOCAINE ) 1 % injection 10 mL (10 mLs Infiltration Given by Other 10/27/23 1520)                                Medical Decision Making Amount and/or Complexity of Data Reviewed Labs: ordered. Radiology: ordered.   This patient is a 28 year old female who presents to the ED for concern of right hand pain over dorsum, 4th and 5th fingers with decreased ROM to 4th and 5th fingers after tattoo done 4 days ago.  Reports that she believes a tattoo artist went to deep when doing a tattoo.  Denies fever, numbness, weakness, tingling.  On physical exam, patient is in no acute distress, afebrile, alert and orient x 4, speaking in full sentences, nontachypneic, nontachycardic.   Good sensation over hand, digits with good cap refill.  Good radial pulse.  Erythema, swelling noted to fingers, dorsum of hand with purulent drainage noted.  4th and 5th fingers have decreased ROM with inability to fully extend or flex.  No crepitus noted.  Areas of induration noted on the dorsum of 4th and 5th fingers and MCP joints.  Unremarkable exam otherwise.  Lab work notes a mild anemia with hemoglobin 11.0 but labs are  otherwise unremarkable.  With x-ray of right hand not showing any osseous abnormalities and/or subcu gas.  Provided Toradol  for additional pain relief.  Case was discussed with attending who agreed that area needed to be unroofed.  Areas of induration were deroofed.  With purulent drainage noted on exam.  She was placed on doxycycline  and have her follow-up with hand surgery.  Patient vital signs have remained stable throughout the course of patient's time in the ED. Low suspicion for any other emergent pathology at this time. I believe this patient is safe to be discharged. Provided strict return to ER precautions. Patient expressed agreement and understanding of plan. All questions were answered.  Differential diagnoses prior to evaluation: The emergent differential diagnosis includes, but is not limited to, cellulitis, abscess, dermatitis, necrotizing fasciitis, compartment syndrome, allergic reaction, septic arthritis. This is not an exhaustive differential.   Past Medical History / Co-morbidities / Social History: Headache, seizure, migraines  Additional history: Chart reviewed. Pertinent results include:   Seen today at urgent care to be evaluated for infection likely secondary to tattoo she had in her right hand last week.  Reporting increased pain and difficulty moving her 4th and 5th fingers.  Notes some drainage coming from the area.  However denies fever or sensation loss.  With pictures noted in chart.  Lab Tests/Imaging studies: I personally interpreted  labs/imaging and the pertinent results include:    CBC shows a mild anemia with hemoglobin 1.0 CMP unremarkable Pregnancy test negative X-ray of right hand negative without any signs of acute osseous abnormalities or subcu air. I agree with the radiologist interpretation.    Medications: I ordered medication including Toradol , doxycycline .  I have reviewed the patients home medicines and have made adjustments as needed.  Critical Interventions: None  Social Determinants of Health: None  Disposition: After consideration of the diagnostic results and the patients response to treatment, I feel that the patient would benefit from discharge and general.   emergency department workup does not suggest an emergent condition requiring admission or immediate intervention beyond what has been performed at this time. The plan is: Follow-up with hand surgery, doxycycline , return to the ED for any new or worsened symptoms, continuing to keep wound clean and follow-up. The patient is safe for discharge and has been instructed to return immediately for worsening symptoms, change in symptoms or any other concerns.   Final diagnoses:  Cellulitis of right hand  Abscess    ED Discharge Orders          Ordered    doxycycline  (VIBRAMYCIN ) 100 MG capsule  2 times daily        10/27/23 1505               Beola Terrall RAMAN, PA-C 10/27/23 1528    Dean Clarity, MD 10/27/23 1539

## 2023-10-27 NOTE — ED Notes (Signed)
 Patient is being discharged from the Urgent Care and sent to the Emergency Department via POC . Per Rebecca,NP, patient is in need of higher level of care due to r/o infection. Patient is aware and verbalizes understanding of plan of care.  Vitals:   10/27/23 0923  BP: 113/71  Pulse: 62  Resp: 14  Temp: 98.1 F (36.7 C)  SpO2: 99%

## 2024-02-10 ENCOUNTER — Encounter: Admitting: Obstetrics and Gynecology

## 2024-05-17 ENCOUNTER — Encounter: Admitting: Obstetrics and Gynecology

## 2024-06-16 ENCOUNTER — Encounter: Admitting: Obstetrics and Gynecology
